# Patient Record
Sex: Male | Born: 1995 | Race: White | Hispanic: No | Marital: Single | Smoking: Never smoker
Health system: Southern US, Community
[De-identification: ages and names within clinical notes are randomized; demographics above are authoritative.]

## PROBLEM LIST (undated history)

## (undated) HISTORY — PX: WISDOM TOOTH EXTRACTION: SHX21

---
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
## 2016-01-31 DIAGNOSIS — L700 Acne vulgaris: Secondary | ICD-10-CM | POA: Diagnosis not present

## 2016-01-31 DIAGNOSIS — Z79899 Other long term (current) drug therapy: Secondary | ICD-10-CM | POA: Diagnosis not present

## 2016-07-03 DIAGNOSIS — L03818 Cellulitis of other sites: Secondary | ICD-10-CM | POA: Diagnosis not present

## 2016-07-03 DIAGNOSIS — S61402A Unspecified open wound of left hand, initial encounter: Secondary | ICD-10-CM | POA: Diagnosis not present

## 2016-12-04 DIAGNOSIS — J029 Acute pharyngitis, unspecified: Secondary | ICD-10-CM | POA: Diagnosis not present

## 2016-12-17 DIAGNOSIS — L700 Acne vulgaris: Secondary | ICD-10-CM | POA: Diagnosis not present

## 2017-02-18 DIAGNOSIS — L700 Acne vulgaris: Secondary | ICD-10-CM | POA: Diagnosis not present

## 2017-10-30 DIAGNOSIS — Z7189 Other specified counseling: Secondary | ICD-10-CM | POA: Diagnosis not present

## 2017-10-30 DIAGNOSIS — Z1389 Encounter for screening for other disorder: Secondary | ICD-10-CM | POA: Diagnosis not present

## 2017-10-30 DIAGNOSIS — Z8489 Family history of other specified conditions: Secondary | ICD-10-CM | POA: Diagnosis not present

## 2017-10-30 DIAGNOSIS — Z6820 Body mass index (BMI) 20.0-20.9, adult: Secondary | ICD-10-CM | POA: Diagnosis not present

## 2021-09-06 DIAGNOSIS — L0202 Furuncle of face: Secondary | ICD-10-CM | POA: Diagnosis not present

## 2021-09-06 DIAGNOSIS — B9689 Other specified bacterial agents as the cause of diseases classified elsewhere: Secondary | ICD-10-CM | POA: Diagnosis not present

## 2021-11-15 DIAGNOSIS — Z4802 Encounter for removal of sutures: Secondary | ICD-10-CM | POA: Diagnosis not present

## 2022-04-04 DIAGNOSIS — L905 Scar conditions and fibrosis of skin: Secondary | ICD-10-CM | POA: Diagnosis not present

## 2022-04-04 DIAGNOSIS — Z789 Other specified health status: Secondary | ICD-10-CM | POA: Diagnosis not present

## 2022-04-04 DIAGNOSIS — I781 Nevus, non-neoplastic: Secondary | ICD-10-CM | POA: Diagnosis not present

## 2022-04-04 DIAGNOSIS — L538 Other specified erythematous conditions: Secondary | ICD-10-CM | POA: Diagnosis not present

## 2022-05-13 DIAGNOSIS — Z8241 Family history of sudden cardiac death: Secondary | ICD-10-CM | POA: Diagnosis not present

## 2022-05-13 DIAGNOSIS — R030 Elevated blood-pressure reading, without diagnosis of hypertension: Secondary | ICD-10-CM | POA: Diagnosis not present

## 2022-05-24 DIAGNOSIS — B368 Other specified superficial mycoses: Secondary | ICD-10-CM | POA: Diagnosis not present

## 2022-05-24 DIAGNOSIS — L718 Other rosacea: Secondary | ICD-10-CM | POA: Diagnosis not present

## 2022-05-24 DIAGNOSIS — L0212 Furuncle of neck: Secondary | ICD-10-CM | POA: Diagnosis not present

## 2022-05-29 ENCOUNTER — Encounter (HOSPITAL_BASED_OUTPATIENT_CLINIC_OR_DEPARTMENT_OTHER): Payer: Self-pay

## 2022-06-04 ENCOUNTER — Ambulatory Visit (HOSPITAL_BASED_OUTPATIENT_CLINIC_OR_DEPARTMENT_OTHER): Payer: BC Managed Care – PPO | Admitting: Cardiology

## 2022-06-04 ENCOUNTER — Encounter (HOSPITAL_BASED_OUTPATIENT_CLINIC_OR_DEPARTMENT_OTHER): Payer: Self-pay | Admitting: Cardiology

## 2022-06-04 VITALS — BP 126/78 | HR 54 | Ht 73.5 in | Wt 190.0 lb

## 2022-06-04 DIAGNOSIS — Z8241 Family history of sudden cardiac death: Secondary | ICD-10-CM

## 2022-06-04 DIAGNOSIS — Z13220 Encounter for screening for lipoid disorders: Secondary | ICD-10-CM

## 2022-06-04 DIAGNOSIS — Z7189 Other specified counseling: Secondary | ICD-10-CM

## 2022-06-04 NOTE — Patient Instructions (Signed)
Labwork: FASTING LP/LPa SOON   Testing/Procedures: NONE  Follow-Up: TO BE DETERMINED BASED ON THE INFORMATION YOU GET FOR DR Harrell Gave

## 2022-06-04 NOTE — Progress Notes (Signed)
Cardiology Office Note:    Date:  06/04/2022   ID:  Anthony Singh, DOB 01-Sep-1995, MRN [id number]  PCP:  Georgiann Hahn, MD  Cardiologist:  Jodelle Red, MD  Referring MD: Garlan Fillers, MD   CC: new patient consultation for family history of sudden cardiac death  History of Present Illness:    Anthony Singh is a 27 y.o. male with no cardiac history who is seen as a new consult at the request of Garlan Fillers, MD for the evaluation and management of family history of sudden cardiac death.  Today, the patient presents for the evaluation of familial sudden cardiac death. His father died of sudden cardiac death at age 36 after playing golf. He had just felt ill at the time and collapsed in the hospital waiting room. He wants screening to determine if he is at risk. His sister had paroxsymal SVT and had ablation after having a resting heart rate of 140 upon diagnosis she is now at 90 bpm. His grandfather on his dad's side died in a car accident and it was assumed that he also had a similar issue to his father.  As for his personal cardiac history he stays active 4x a week running or weightlifting. He has had no episodes of syncope, chest pain, or excessive shortness of breath. He denies any long lasting fever or quick weight loss.   He denies any palpitations, chest pain, shortness of breath, or peripheral edema. No lightheadedness, headaches, syncope, orthopnea, or PND.  No past medical history on file.  Past Surgical History:  Procedure Laterality Date   WISDOM TOOTH EXTRACTION Bilateral    2016    Current Medications: Current Outpatient Medications on File Prior to Visit  Medication Sig   doxycycline (VIBRAMYCIN) 100 MG capsule Take 100 mg by mouth daily as needed.   No current facility-administered medications on file prior to visit.     Allergies:   Patient has no known allergies.   Social History   Tobacco Use   Smoking status: Never   Smokeless  tobacco: Never  Substance Use Topics   Alcohol use: Yes   Drug use: No    Family History: family history includes Sudden Cardiac Death in his father; Supraventricular tachycardia in his sister.  ROS:   Please see the history of present illness.    Additional pertinent ROS: Constitutional: Negative for chills, fever, night sweats, unintentional weight loss  HENT: Negative for ear pain and hearing loss.   Eyes: Negative for loss of vision and eye pain.  Respiratory: Negative for cough, sputum, wheezing.   Cardiovascular: See HPI. Gastrointestinal: Negative for abdominal pain, melena, and hematochezia.  Genitourinary: Negative for dysuria and hematuria.  Musculoskeletal: Negative for falls and myalgias.  Skin: Negative for itching and rash.  Neurological: Negative for focal weakness, focal sensory changes and loss of consciousness.  Endo/Heme/Allergies: Does not bruise/bleed easily.     EKGs/Labs/Other Studies Reviewed:    The following studies were reviewed today: No prior cardiac studies  EKG:  EKG is personally reviewed.   06/04/2022: sinus bradycardia at 54 bpm, normal QT  Recent Labs: No results found for requested labs within last 365 days.  Recent Lipid Panel No results found for: "CHOL", "TRIG", "HDL", "CHOLHDL", "VLDL", "LDLCALC", "LDLDIRECT"  Physical Exam:    VS:  BP 126/78   Pulse (!) 54   Ht 6' 1.5" (1.867 m)   Wt 190 lb (86.2 kg)   BMI 24.73 kg/m     Wt  Readings from Last 3 Encounters:  06/04/22 190 lb (86.2 kg)  10/20/14 150 lb 4.8 oz (68.2 kg) (51 %, Z= 0.02)*  10/22/12 139 lb (63 kg) (53 %, Z= 0.07)*   * Growth percentiles are based on CDC (Boys, 2-20 Years) data.    GEN: Well nourished, well developed in no acute distress HEENT: Normal, moist mucous membranes NECK: No JVD CARDIAC: regular rhythm, normal S1 and S2, no rubs or gallops. No murmur. VASCULAR: Radial and DP pulses 2+ bilaterally. No carotid bruits RESPIRATORY:  Clear to auscultation  without rales, wheezing or rhonchi  ABDOMEN: Soft, non-tender, non-distended MUSCULOSKELETAL:  Ambulates independently SKIN: Warm and dry, no edema NEUROLOGIC:  Alert and oriented x 3. No focal neuro deficits noted. PSYCHIATRIC:  Normal affect    ASSESSMENT:    1. Family history of sudden cardiac death in father   2. Screening for lipid disorders   3. Cardiac risk counseling   4. Counseling on health promotion and disease prevention    PLAN:    Family history of sudden cardiac death -father died age 30 after collapsing, no prior history -sister with pSVT s/p ablation -no high risk features on ECG -no other family history -unclear etiology (arrythmogenic vs ischemic). He will discuss with his family to get more information -will screen for lipids given history -he is active and asymptomatic at this time -reviewed red flag warning signs that need immediate medical attention  Cardiac risk counseling and prevention recommendations: -recommend heart healthy/Mediterranean diet, with whole grains, fruits, vegetable, fish, lean meats, nuts, and olive oil. Limit salt. -recommend moderate walking, 3-5 times/week for 30-50 minutes each session. Aim for at least 150 minutes.week. Goal should be pace of 3 miles/hours, or walking 1.5 miles in 30 minutes -recommend avoidance of tobacco products. Avoid excess alcohol. -ASCVD risk score: The ASCVD Risk score (Arnett DK, et al., 2019) failed to calculate for the following reasons:   The 2019 ASCVD risk score is only valid for ages 81 to 31    Plan for follow up: as needed based on additional family history information  Jodelle Red, MD, PhD, [facility]  [facility]  [facility] [address] [place], [place] [address] [phone]   Medication Adjustments/Labs and Tests Ordered: Current medicines are reviewed at length with the patient  today.  Concerns regarding medicines are outlined above.  Orders Placed This Encounter  Procedures   Lipid panel   Lipoprotein A (LPA)   EKG 12-Lead   No orders of the defined types were placed in this encounter.   Patient Instructions  Labwork: FASTING LP/LPa SOON   Testing/Procedures: NONE  Follow-Up: TO BE DETERMINED BASED ON THE INFORMATION YOU GET FOR DR Jadynn Epping     I,Coren O'Brien,acting as a scribe for [employer], MD.,have documented all relevant documentation on the behalf of Jodelle Red, MD,as directed by  Jodelle Red, MD while in the presence of Jodelle Red, MD.  I, Jodelle Red, MD, have reviewed all documentation for this visit. The documentation on 06/30/22 for the exam, diagnosis, procedures, and orders are all accurate and complete.

## 2022-06-05 DIAGNOSIS — Z8241 Family history of sudden cardiac death: Secondary | ICD-10-CM | POA: Diagnosis not present

## 2022-06-05 DIAGNOSIS — Z13220 Encounter for screening for lipoid disorders: Secondary | ICD-10-CM | POA: Diagnosis not present

## 2022-06-06 LAB — LIPID PANEL
Chol/HDL Ratio: 2.8 ratio (ref 0.0–5.0)
Cholesterol, Total: 131 mg/dL (ref 100–199)
HDL: 47 mg/dL (ref >39)
LDL Chol Calc (NIH): 73 mg/dL (ref 0–99)
Triglycerides: 45 mg/dL (ref 0–149)
VLDL Cholesterol Cal: 11 mg/dL (ref 5–40)

## 2022-06-06 LAB — LIPOPROTEIN A (LPA): Lipoprotein (a): 14.6 nmol/L (ref <75.0)

## 2022-06-30 ENCOUNTER — Encounter (HOSPITAL_BASED_OUTPATIENT_CLINIC_OR_DEPARTMENT_OTHER): Payer: Self-pay | Admitting: Cardiology

## 2022-07-03 DIAGNOSIS — L718 Other rosacea: Secondary | ICD-10-CM | POA: Diagnosis not present
